# Patient Record
Sex: Male | Born: 2000 | Race: Black or African American | Hispanic: No | Marital: Single | Smoking: Former smoker
Health system: Southern US, Community
[De-identification: ages and names within clinical notes are randomized; demographics above are authoritative.]

## PROBLEM LIST (undated history)

## (undated) DIAGNOSIS — Z9109 Other allergy status, other than to drugs and biological substances: Secondary | ICD-10-CM

## (undated) HISTORY — PX: CIRCUMCISION: SHX1350

## (undated) HISTORY — PX: TONSILLECTOMY: SUR1361

---
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
## 2019-04-19 ENCOUNTER — Other Ambulatory Visit: Payer: Self-pay

## 2019-04-19 ENCOUNTER — Encounter (HOSPITAL_BASED_OUTPATIENT_CLINIC_OR_DEPARTMENT_OTHER): Payer: Self-pay

## 2019-04-19 ENCOUNTER — Emergency Department (HOSPITAL_BASED_OUTPATIENT_CLINIC_OR_DEPARTMENT_OTHER)
Admission: EM | Admit: 2019-04-19 | Discharge: 2019-04-19 | Disposition: A | Discharge status: Home / Self Care | Payer: Medicaid Other | Attending: Emergency Medicine | Admitting: Emergency Medicine

## 2019-04-19 DIAGNOSIS — Z87891 Personal history of nicotine dependence: Secondary | ICD-10-CM | POA: Diagnosis not present

## 2019-04-19 DIAGNOSIS — Z79899 Other long term (current) drug therapy: Secondary | ICD-10-CM | POA: Diagnosis not present

## 2019-04-19 DIAGNOSIS — H579 Unspecified disorder of eye and adnexa: Secondary | ICD-10-CM | POA: Insufficient documentation

## 2019-04-19 MED ORDER — PROPARACAINE HCL 0.5 % OP SOLN
1.0000 [drp] | Freq: Once | OPHTHALMIC | Status: AC
  Administered 2019-04-19: 22:00:00 1 [drp] via OPHTHALMIC
  Filled 2019-04-19: qty 15

## 2019-04-19 MED ORDER — FLUORESCEIN SODIUM 1 MG OP STRP
1.0000 | ORAL_STRIP | Freq: Once | OPHTHALMIC | Status: AC
  Administered 2019-04-19: 22:00:00 1 via OPHTHALMIC
  Filled 2019-04-19: qty 1

## 2019-04-19 NOTE — ED Triage Notes (Signed)
Pt c/o ?fb to left eye while jogging ~10 min PTA-NAD-steady gait

## 2019-04-19 NOTE — Discharge Instructions (Signed)
You can use lubricating eyedrops (saline drops or artificial tears), available over-the-counter as needed. Get rechecked immediately if you develop severe pain to your eye or loss or change in vision.

## 2019-04-19 NOTE — ED Notes (Signed)
Patient verbalizes understanding of discharge instructions. Opportunity for questioning and answers were provided. Armband removed by staff, pt discharged from ED.  

## 2019-04-19 NOTE — ED Provider Notes (Signed)
[facility] EMERGENCY DEPARTMENT Provider Note   CSN: [id number] Arrival date & time: 04/19/19  2037    History   Chief Complaint Chief Complaint  Patient presents with  . Foreign Body in Eye    HPI Truddie Crumbleazir [place] is a 18 y.o. male.     The history is provided by the patient. No language interpreter was used.   Truddie Crumbleazir [place] is a 18 y.o. male who presents to the Emergency Department complaining of foreign body in his eye. He was jogging earlier today when a gnat flew in his left eye. This occurred just prior to ED arrival. He feels like it is still in there and he has mild irritation to his eye with sensation of blurred vision initially that is not resolving. He has no medical problems takes the medications. He does not wear corrective lenses. Symptoms are mild and constant nature. Past Medical History:  Diagnosis Date  . Environmental allergies     Patient Active Problem List   Diagnosis Date Noted  . Excessive physiologic tremor 06/11/2017  . Anxiety state 06/11/2017    Past Surgical History:  Procedure Laterality Date  . CIRCUMCISION    . TONSILLECTOMY          Home Medications    Prior to Admission medications   Medication Sig Start Date End Date Taking? Authorizing Provider  FLUoxetine (PROZAC) 10 MG capsule Take 10 mg by mouth. 06/04/17   [provider]  meloxicam (MOBIC) 15 MG tablet Take 1 tablet (15 mg total) by mouth daily. 05/20/17   Harris, Cammy CopaAbigail, PA-C  Montelukast Sodium (SINGULAIR PO) Take by mouth.    [provider]    Family History No family history on file.  Social History Social History   Tobacco Use  . Smoking status: Former Games developermoker  . Smokeless tobacco: Never Used  Substance Use Topics  . Alcohol use: Yes    Comment: occ  . Drug use: No     Allergies   Azithromycin   Review of Systems Review of Systems  All other systems reviewed and are negative.    Physical Exam Updated Vital Signs BP (!)  141/80 (BP Location: Left Arm)   Pulse 80   Temp 98.6 F (37 C) (Oral)   Resp 18   Ht 5\' 7"  (1.702 m)   Wt 72.1 kg   SpO2 100%   BMI 24.90 kg/m   Physical Exam Vitals signs and nursing note reviewed.  Constitutional:      Appearance: Normal appearance.  HENT:     Head: Normocephalic and atraumatic.  Eyes:     Extraocular Movements: Extraocular movements intact.     Pupils: Pupils are equal, round, and reactive to light.     Comments: Mild left conjunctival injection. No uptake on floor seen staining. No foreign body. Eyelids everted for exam.  Cardiovascular:     Rate and Rhythm: Normal rate and regular rhythm.  Pulmonary:     Effort: Pulmonary effort is normal. No respiratory distress.  Musculoskeletal:     Comments: Normal gait  Skin:    General: Skin is warm and dry.  Neurological:     Mental Status: He is alert and oriented to person, place, and time.     Comments: No asymmetry of facial muscles.    Psychiatric:        Mood and Affect: Mood normal.        Behavior: Behavior normal.      ED Treatments / Results  Labs (all labs ordered are listed, but only abnormal results are displayed) Labs Reviewed - No data to display  EKG None  Radiology No results found.  Procedures Procedures (including critical care time)  Medications Ordered in ED Medications  fluorescein ophthalmic strip 1 strip (1 strip Left Eye Given 04/19/19 2226)  proparacaine (ALCAINE) 0.5 % ophthalmic solution 1 drop (1 drop Left Eye Given 04/19/19 2225)     Initial Impression / Assessment and Plan / ED Course  I have reviewed the triage vital signs and the nursing notes.  Pertinent labs & imaging results that were available during my care of the patient were reviewed by me and considered in my medical decision making (see chart for details).        Patient here for evaluation of possible foreign body in his eye. On examination there is no clear evidence of foreign body, no corneal  abrasion. He did have relief of the symptoms after preparing Anda Kraft administration. Discussed with patient home care for eye irritation. Discussed outpatient follow-up and return precautions.  Final Clinical Impressions(s) / ED Diagnoses   Final diagnoses:  Sensation of foreign body in eye    ED Discharge Orders    None       Quintella Reichert, MD 04/20/19 0009
